# Patient Record
Sex: Female | Born: 2000 | Race: White | Hispanic: No | Marital: Single | Smoking: Never smoker
Health system: Southern US, Community
[De-identification: ages and names within clinical notes are randomized; demographics above are authoritative.]

## PROBLEM LIST (undated history)

## (undated) DIAGNOSIS — L509 Urticaria, unspecified: Secondary | ICD-10-CM

## (undated) DIAGNOSIS — T783XXA Angioneurotic edema, initial encounter: Secondary | ICD-10-CM

## (undated) HISTORY — DX: Urticaria, unspecified: L50.9

## (undated) HISTORY — PX: SINOSCOPY: SHX187

## (undated) HISTORY — DX: Angioneurotic edema, initial encounter: T78.3XXA

---
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
## 2020-03-24 DIAGNOSIS — Z20822 Contact with and (suspected) exposure to covid-19: Secondary | ICD-10-CM | POA: Diagnosis not present

## 2020-04-25 DIAGNOSIS — F322 Major depressive disorder, single episode, severe without psychotic features: Secondary | ICD-10-CM | POA: Diagnosis not present

## 2020-04-26 ENCOUNTER — Ambulatory Visit: Payer: Self-pay | Admitting: Allergy

## 2020-06-07 ENCOUNTER — Other Ambulatory Visit: Payer: Self-pay

## 2020-06-07 ENCOUNTER — Encounter: Payer: Self-pay | Admitting: Allergy

## 2020-06-07 ENCOUNTER — Ambulatory Visit: Payer: BC Managed Care – PPO | Admitting: Allergy

## 2020-06-07 VITALS — BP 114/66 | HR 66 | Temp 98.1°F | Resp 18 | Ht 61.0 in | Wt 140.2 lb

## 2020-06-07 DIAGNOSIS — T783XXD Angioneurotic edema, subsequent encounter: Secondary | ICD-10-CM

## 2020-06-07 DIAGNOSIS — T7840XD Allergy, unspecified, subsequent encounter: Secondary | ICD-10-CM | POA: Diagnosis not present

## 2020-06-07 DIAGNOSIS — T7840XA Allergy, unspecified, initial encounter: Secondary | ICD-10-CM | POA: Insufficient documentation

## 2020-06-07 DIAGNOSIS — J3089 Other allergic rhinitis: Secondary | ICD-10-CM

## 2020-06-07 DIAGNOSIS — T783XXA Angioneurotic edema, initial encounter: Secondary | ICD-10-CM

## 2020-06-07 HISTORY — DX: Angioneurotic edema, initial encounter: T78.3XXA

## 2020-06-07 NOTE — Patient Instructions (Addendum)
Allergic reaction:  Continue to avoid tylenol and NSAIDs including ibuprofen.  For mild symptoms you can take over the counter antihistamines such as Benadryl and monitor symptoms closely. If symptoms worsen or if you have severe symptoms including breathing issues, throat closure, significant swelling, whole body hives, severe diarrhea and vomiting, lightheadedness then seek immediate medical care.  Get bloodwork to rule out other causes.  We are ordering labs, so please allow 1-2 weeks for the results to come back. With the newly implemented Cures Act, the labs might be visible to you at the same time that they become visible to me. However, I will not address the results until all of the results are back, so please be patient.  In the meantime, continue recommendations in your patient instructions, including avoidance measures (if applicable), until you hear from me.  Return for Tylenol drug challenge - bring liquid dye free Tylenol and the Tylenol pill that yyou took.   You must be off antihistamines for 3-5 days before. Must be in good health and not ill. Plan on being in the office for 2-3 hours and must bring in the drug you want to do the oral challenge for.   May 6th at 8:30AM

## 2020-06-07 NOTE — Progress Notes (Signed)
New Patient Note  RE: Angela Hansen MRN: [id number] DOB: 04-16-2000 Date of Office Visit: 06/07/2020  Consult requested by: Sonny Masters, NP Primary care provider: Sonny Masters, NP  Chief Complaint: Allergic Reaction  History of Present Illness: I had the pleasure of seeing Angela Hansen for initial evaluation at the [facility] on 06/08/2020. She is a 20 y.o. female, who is referred here by PCP for the evaluation of allergic reaction.  In December 2021 patient took ibuprofen 200mg  x 2 tablets for menstrual pain. A few hours later patient noted facial swelling, facial rash, tongue tingling, lip swelling, scalp pruritus. No respiratory compromise.  She took some type of allergy pill and the symptoms resolved within a few hours.  About 2 weeks later patient had a headache and took OTC Tylenol 1 pill and about 3-4 hours afterwards she noted periorbital swelling only. She took some type of allergy pill and the symptoms resolved within a few hours.   Suspected triggers are unknown. Denies any fevers, chills, changes in medications, foods, personal care products or recent infections.  Since then she had not taken any NSAID or Tylenol and previously tolerated without any issues. No other history of allergic reactions as above.   Patient was at home from college during these episodes but she has been back home since then with no additional episodes.   Assessment and Plan: Angela Hansen is a 20 y.o. female with: Allergic reaction 2 episodes of allergic reaction in the form of facial swelling, facial rash in December. 1 episode after taking ibuprofen and another after taking Tylenol. Symptoms resolved after taking antihistamines. Previously tolerated without any issues. Denies any other changes in diet, meds, personal care products.  Continue to avoid tylenol and NSAIDs including ibuprofen.  For mild symptoms you can take over the counter antihistamines such as  Benadryl and monitor symptoms closely. If symptoms worsen or if you have severe symptoms including breathing issues, throat closure, significant swelling, whole body hives, severe diarrhea and vomiting, lightheadedness then seek immediate medical care. Get bloodwork to rule out other etiologies.   Return for Tylenol drug challenge first.  Then will do NSAID challenge.   Other allergic rhinitis Mild rhino conjunctivitis symptoms and takes zyrtec prn with good benefit. Usually flares in the fall and spring.  Declines environmental allergy testing today.   May use over the counter antihistamines such as Zyrtec (cetirizine), Claritin (loratadine), Allegra (fexofenadine), or Xyzal (levocetirizine) daily as needed.  Return in about 23 days (around 06/30/2020) for Drug challenge.  No orders of the defined types were placed in this encounter.   Lab Orders     CBC with Differential/Platelet     C1 esterase inhibitor, functional     C1 Esterase Inhibitor     ANA w/Reflex     Alpha-Gal Panel     Comprehensive metabolic panel     Tryptase     Thyroid Cascade Profile     C-reactive protein     Sedimentation rate  Other allergy screening: Asthma: no Rhino conjunctivitis: yes  Nasal congestion, red eyes and takes zyrtec prn with good benefit. Symptoms usually flare in the fall and spring.  Food allergy: no   Hymenoptera allergy: no Urticaria: no Eczema:no History of recurrent infections suggestive of immunodeficency: no  Diagnostics: None.  Past Medical History: Patient Active Problem List   Diagnosis Date Noted  . Other allergic rhinitis 06/08/2020  . Allergic reaction 06/07/2020  . Angio-edema 06/07/2020  Past Medical History:  Diagnosis Date  . Angio-edema   . Urticaria    Past Surgical History: Past Surgical History:  Procedure Laterality Date  . NASAL SEPTUM SURGERY N/A 2018  . SINOSCOPY     Medication List:  Current Outpatient Medications  Medication Sig Dispense  Refill  . cetirizine (ZYRTEC) 10 MG tablet Take 10 mg by mouth daily.    Marland Kitchen lamoTRIgine (LAMICTAL PO) Take 1 tablet by mouth daily.    Marland Kitchen venlafaxine (EFFEXOR) 75 MG tablet Take 75 mg by mouth daily.     No current facility-administered medications for this visit.   Allergies: Not on File Social History: Social History   Socioeconomic History  . Marital status: Unknown    Spouse name: Not on file  . Number of children: Not on file  . Years of education: Not on file  . Highest education level: Not on file  Occupational History  . Not on file  Tobacco Use  . Smoking status: Never Smoker  . Smokeless tobacco: Never Used  Vaping Use  . Vaping Use: Never used  Substance and Sexual Activity  . Alcohol use: Never  . Drug use: Never  . Sexual activity: Not on file  Other Topics Concern  . Not on file  Social History Narrative  . Not on file   Social Determinants of Health   Financial Resource Strain: Not on file  Food Insecurity: Not on file  Transportation Needs: Not on file  Physical Activity: Not on file  Stress: Not on file  Social Connections: Not on file   Lives in a 20 year old house. Smoking: denies Occupation: Hydrographic surveyor HistorySurveyor, minerals in the house: no Engineer, civil (consulting) in the family room: no Carpet in the bedroom: yes Heating: electric Cooling: central Pet: yes 2 dogs, 3 cats  Family History: Family History  Problem Relation Age of Onset  . Allergic rhinitis Mother   . Allergic rhinitis Father    Problem                               Relation Asthma                                   No  Eczema                                No  Food allergy                          Mother   Angioedema    No  Review of Systems  Constitutional: Negative for appetite change, chills, fever and unexpected weight change.  HENT: Negative for congestion and rhinorrhea.   Eyes: Negative for itching.  Respiratory: Negative for cough, chest tightness,  shortness of breath and wheezing.   Cardiovascular: Negative for chest pain.  Gastrointestinal: Negative for abdominal pain.  Genitourinary: Negative for difficulty urinating.  Skin: Negative for rash.  Neurological: Negative for headaches.   Objective: BP 114/66 (BP Location: Left Arm, Patient Position: Sitting, Cuff Size: Normal)   Pulse 66   Temp 98.1 F (36.7 C) (Temporal)   Resp 18   Ht 5\' 1"  (1.549 m)   Wt 140 lb 3.2 oz (63.6 kg)   SpO2 97%  BMI 26.49 kg/m  Body mass index is 26.49 kg/m. Physical Exam Vitals and nursing note reviewed.  Constitutional:      Appearance: Normal appearance. She is well-developed.  HENT:     Head: Normocephalic and atraumatic.     Right Ear: External ear normal.     Left Ear: External ear normal.     Nose: Nose normal.     Mouth/Throat:     Mouth: Mucous membranes are moist.     Pharynx: Oropharynx is clear.  Eyes:     Conjunctiva/sclera: Conjunctivae normal.  Cardiovascular:     Rate and Rhythm: Normal rate and regular rhythm.     Heart sounds: Normal heart sounds. No murmur heard. No friction rub. No gallop.   Pulmonary:     Effort: Pulmonary effort is normal.     Breath sounds: Normal breath sounds. No wheezing, rhonchi or rales.  Abdominal:     Palpations: Abdomen is soft.  Musculoskeletal:     Cervical back: Neck supple.  Skin:    General: Skin is warm.     Findings: No rash.  Neurological:     Mental Status: She is alert and oriented to person, place, and time.  Psychiatric:        Behavior: Behavior normal.    The plan was reviewed with the patient/family, and all questions/concerned were addressed.  It was my pleasure to see Angela Hansen today and participate in her care. Please feel free to contact me with any questions or concerns.  Sincerely,  Wyline Mood, DO Allergy & Immunology  [facility] office: [phone] [facility] office: [phone]

## 2020-06-08 DIAGNOSIS — J3089 Other allergic rhinitis: Secondary | ICD-10-CM

## 2020-06-08 HISTORY — DX: Other allergic rhinitis: J30.89

## 2020-06-08 NOTE — Assessment & Plan Note (Signed)
Mild rhino conjunctivitis symptoms and takes zyrtec prn with good benefit. Usually flares in the fall and spring.  Declines environmental allergy testing today.   May use over the counter antihistamines such as Zyrtec (cetirizine), Claritin (loratadine), Allegra (fexofenadine), or Xyzal (levocetirizine) daily as needed.

## 2020-06-08 NOTE — Assessment & Plan Note (Signed)
2 episodes of allergic reaction in the form of facial swelling, facial rash in December. 1 episode after taking ibuprofen and another after taking Tylenol. Symptoms resolved after taking antihistamines. Previously tolerated without any issues. Denies any other changes in diet, meds, personal care products.  Continue to avoid tylenol and NSAIDs including ibuprofen.  For mild symptoms you can take over the counter antihistamines such as Benadryl and monitor symptoms closely. If symptoms worsen or if you have severe symptoms including breathing issues, throat closure, significant swelling, whole body hives, severe diarrhea and vomiting, lightheadedness then seek immediate medical care. Get bloodwork to rule out other etiologies.   Return for Tylenol drug challenge first.  Then will do NSAID challenge.

## 2020-06-12 LAB — CBC WITH DIFFERENTIAL/PLATELET
Basophils Absolute: 0 10*3/uL (ref 0.0–0.2)
Basos: 1 %
EOS (ABSOLUTE): 0.3 10*3/uL (ref 0.0–0.4)
Eos: 3 %
Hematocrit: 40.6 % (ref 34.0–46.6)
Hemoglobin: 13.6 g/dL (ref 11.1–15.9)
Immature Grans (Abs): 0 10*3/uL (ref 0.0–0.1)
Immature Granulocytes: 0 %
Lymphocytes Absolute: 2.2 10*3/uL (ref 0.7–3.1)
Lymphs: 29 %
MCH: 30.8 pg (ref 26.6–33.0)
MCHC: 33.5 g/dL (ref 31.5–35.7)
MCV: 92 fL (ref 79–97)
Monocytes Absolute: 0.5 10*3/uL (ref 0.1–0.9)
Monocytes: 7 %
Neutrophils Absolute: 4.6 10*3/uL (ref 1.4–7.0)
Neutrophils: 60 %
Platelets: 288 10*3/uL (ref 150–450)
RBC: 4.42 x10E6/uL (ref 3.77–5.28)
RDW: 12.7 % (ref 11.7–15.4)
WBC: 7.6 10*3/uL (ref 3.4–10.8)

## 2020-06-12 LAB — COMPREHENSIVE METABOLIC PANEL
ALT: 16 IU/L (ref 0–32)
AST: 17 IU/L (ref 0–40)
Albumin/Globulin Ratio: 2 (ref 1.2–2.2)
Albumin: 5.1 g/dL — ABNORMAL HIGH (ref 3.9–5.0)
Alkaline Phosphatase: 74 IU/L (ref 42–106)
BUN/Creatinine Ratio: 18 (ref 9–23)
BUN: 13 mg/dL (ref 6–20)
Bilirubin Total: 0.5 mg/dL (ref 0.0–1.2)
CO2: 20 mmol/L (ref 20–29)
Calcium: 9.7 mg/dL (ref 8.7–10.2)
Chloride: 101 mmol/L (ref 96–106)
Creatinine, Ser: 0.71 mg/dL (ref 0.57–1.00)
Globulin, Total: 2.6 g/dL (ref 1.5–4.5)
Glucose: 94 mg/dL (ref 65–99)
Potassium: 4.1 mmol/L (ref 3.5–5.2)
Sodium: 139 mmol/L (ref 134–144)
Total Protein: 7.7 g/dL (ref 6.0–8.5)
eGFR: 126 mL/min/{1.73_m2} (ref >59)

## 2020-06-12 LAB — C1 ESTERASE INHIBITOR, FUNCTIONAL: C1INH Functional/C1INH Total MFr SerPl: >100 %mean normal

## 2020-06-12 LAB — C-REACTIVE PROTEIN: CRP: 2 mg/L (ref 0–10)

## 2020-06-12 LAB — SEDIMENTATION RATE: Sed Rate: 17 mm/hr (ref 0–32)

## 2020-06-12 LAB — TRYPTASE: Tryptase: 6.6 ug/L (ref 2.2–13.2)

## 2020-06-12 LAB — ALPHA-GAL PANEL
Allergen Lamb IgE: 0.13 kU/L — AB
Beef IgE: 0.15 kU/L — AB
IgE (Immunoglobulin E), Serum: 264 IU/mL (ref 6–495)
O215-IgE Alpha-Gal: <0.1 kU/L
Pork IgE: <0.1 kU/L

## 2020-06-12 LAB — ANA W/REFLEX: Anti Nuclear Antibody (ANA): NEGATIVE

## 2020-06-12 LAB — C1 ESTERASE INHIBITOR: C1INH SerPl-mCnc: 31 mg/dL (ref 21–39)

## 2020-06-12 LAB — THYROID CASCADE PROFILE: TSH: 1.87 u[IU]/mL (ref 0.450–4.500)

## 2020-06-30 ENCOUNTER — Encounter: Payer: Self-pay | Admitting: Allergy

## 2020-06-30 ENCOUNTER — Ambulatory Visit: Payer: BC Managed Care – PPO | Admitting: Allergy

## 2020-06-30 ENCOUNTER — Other Ambulatory Visit: Payer: Self-pay

## 2020-06-30 VITALS — BP 118/72 | HR 98 | Temp 97.9°F | Resp 16

## 2020-06-30 DIAGNOSIS — T50995D Adverse effect of other drugs, medicaments and biological substances, subsequent encounter: Secondary | ICD-10-CM | POA: Diagnosis not present

## 2020-06-30 DIAGNOSIS — T7840XD Allergy, unspecified, subsequent encounter: Secondary | ICD-10-CM

## 2020-06-30 NOTE — Patient Instructions (Addendum)
You had a total of 162.5mg  of Tylenol today and 25mg  of liquid Benadryl.   Continue to avoid tylenol and NSAIDs including ibuprofen.  For mild symptoms you can take over the counter antihistamines such as Benadryl (1-2 tablets every 4-6 hours as needed) and monitor symptoms closely. If symptoms worsen or if you have severe symptoms including breathing issues, throat closure, significant swelling, whole body hives, severe diarrhea and vomiting, lightheadedness then seek immediate medical care.  Return for ibuprofen drug challenge.  You must be off antihistamines for 3-5 days before. Must be in good health and not ill. Plan on being in the office for 2-3 hours and must bring in the drug you want to do the oral challenge for.

## 2020-06-30 NOTE — Assessment & Plan Note (Signed)
Past history - 2 episodes of allergic reaction in the form of facial swelling, facial rash in December. 1 episode after taking ibuprofen and another after taking Tylenol. Symptoms resolved after taking antihistamines. Previously tolerated without any issues. Denies any other changes in diet, meds, personal care products.  Interim history - bloodwork unremarkable. No issues with red dye.   Patient developed some itching after first dose (Tylenol 1/8th tablet 40.625mg ). Symptoms did not worsen and repeated the same dose with no worsening symptoms.  Patient received Tylenol 1/4th tablet 81mg  and noted more profound itching. Still no rash or any other symptoms.  Challenge was stopped and treated with Benadryl 25mg .  Monitored for about 1 hour afterwards and itching resolved. Vitals stable.   Continue to avoid tylenol and NSAIDs including ibuprofen.  For mild symptoms you can take over the counter antihistamines such as Benadryl (1-2 tablets every 4-6 hours as needed) and monitor symptoms closely. If symptoms worsen or if you have severe symptoms including breathing issues, throat closure, significant swelling, whole body hives, severe diarrhea and vomiting, lightheadedness then seek immediate medical care.  Return for ibuprofen drug challenge.

## 2020-06-30 NOTE — Progress Notes (Signed)
Follow Up Note  RE: Angela Hansen MRN: [id number] DOB: 11/18/2000 Date of Office Visit: 06/30/2020  Referring provider: No ref. provider found Primary care provider: Sonny Masters, NP  Chief Complaint: Food/Drug Challenge (Tylenol)  Assessment and Plan: Angela Hansen is a 20 y.o. female with: Adverse effect of other drugs, medicaments and biological substances, subsequent encounter Past history - 2 episodes of allergic reaction in the form of facial swelling, facial rash in December. 1 episode after taking ibuprofen and another after taking Tylenol. Symptoms resolved after taking antihistamines. Previously tolerated without any issues. Denies any other changes in diet, meds, personal care products.  Interim history - bloodwork unremarkable. No issues with red dye.   Patient developed some itching after first dose (Tylenol 1/8th tablet 40.625mg ). Symptoms did not worsen and repeated the same dose with no worsening symptoms.  Patient received Tylenol 1/4th tablet 81mg  and noted more profound itching. Still no rash or any other symptoms.  Challenge was stopped and treated with Benadryl 25mg .  Monitored for about 1 hour afterwards and itching resolved. Vitals stable.   Continue to avoid tylenol and NSAIDs including ibuprofen.  For mild symptoms you can take over the counter antihistamines such as Benadryl (1-2 tablets every 4-6 hours as needed) and monitor symptoms closely. If symptoms worsen or if you have severe symptoms including breathing issues, throat closure, significant swelling, whole body hives, severe diarrhea and vomiting, lightheadedness then seek immediate medical care.  Return for ibuprofen drug challenge.  Return for Drug challenge.  Plan: Challenge drug: Tylenol Challenge as per protocol: Failed Total time: 137 min  History of Present Illness: I had the pleasure of seeing Angela Hansen for a follow up visit at the [facility] on 06/30/2020.  She is a 19 y.o. female, who is being followed for adverse drug reaction. Her previous allergy office visit was on 06/07/2020 with Dr. 12. Today she is here for Tylenol drug challenge.   Tolerates beef and pork with no issues.   History of Reaction: 2 episodes of allergic reaction in the form of facial swelling, facial rash in December. 1 episode after taking ibuprofen and another after taking Tylenol. Symptoms resolved after taking antihistamines. Previously tolerated without any issues. No additional reactions since the last visit and avoiding any type of pain killers.  Interval History: Patient has not been ill, she has not had any accidental exposures to the culprit medication.   Recent/Current History: Pulmonary disease: no Cardiac disease: no Respiratory infection: no Rash: no Itch: no Swelling: no Cough: no Shortness of breath: no Runny/stuffy nose: no Itchy eyes: no Beta-blocker use: no  Patient/guardian was informed of the test procedure with verbalized understanding of the risk of anaphylaxis. Consent was signed.   Last antihistamine use: none in the past 3 days. Last beta-blocker use: n/a  Medication List:  Current Outpatient Medications  Medication Sig Dispense Refill  . cetirizine (ZYRTEC) 10 MG tablet Take 10 mg by mouth daily.    Angela Hansen lamoTRIgine (LAMICTAL PO) Take 1 tablet by mouth daily.    Angela Hansen venlafaxine (EFFEXOR) 75 MG tablet Take 75 mg by mouth daily.     No current facility-administered medications for this visit.   Allergies: Allergies  Allergen Reactions  . Tylenol [Acetaminophen]     Itching during drug challenge   I reviewed her past medical history, social history, family history, and environmental history and no significant changes have been reported from her previous visit.  Review of Systems  Constitutional: Negative for appetite change, chills, fever and unexpected weight change.  HENT: Negative for congestion and rhinorrhea.   Eyes: Negative  for itching.  Respiratory: Negative for cough, chest tightness, shortness of breath and wheezing.   Cardiovascular: Negative for chest pain.  Gastrointestinal: Negative for abdominal pain.  Genitourinary: Negative for difficulty urinating.  Skin: Negative for rash.  Neurological: Negative for headaches.   Objective: BP 118/72   Pulse 98   Temp 97.9 F (36.6 C) (Temporal)   Resp 16   SpO2 96%  There is no height or weight on file to calculate BMI. Physical Exam Vitals and nursing note reviewed.  Constitutional:      Appearance: Normal appearance. She is well-developed.  HENT:     Head: Normocephalic and atraumatic.     Right Ear: External ear normal.     Left Ear: External ear normal.     Nose: Nose normal.     Mouth/Throat:     Mouth: Mucous membranes are moist.     Pharynx: Oropharynx is clear.  Eyes:     Conjunctiva/sclera: Conjunctivae normal.  Cardiovascular:     Rate and Rhythm: Normal rate and regular rhythm.     Heart sounds: Normal heart sounds. No murmur heard. No friction rub. No gallop.   Pulmonary:     Effort: Pulmonary effort is normal.     Breath sounds: Normal breath sounds. No wheezing, rhonchi or rales.  Abdominal:     Palpations: Abdomen is soft.  Musculoskeletal:     Cervical back: Neck supple.  Skin:    General: Skin is warm.     Findings: No rash.  Neurological:     Mental Status: She is alert and oriented to person, place, and time.  Psychiatric:        Behavior: Behavior normal.     Diagnostics: Results discussed with patient/family.  Oral Challenge - 06/30/20 0900    Challenge Food/Drug Tylenol    Lot #  if Applicable [id number]    Food/Drug provided by Practice    Comments Exp: 08/2020    BP 118/72    Pulse 98    Respirations 16    Lungs 96%    Skin Clear    Mouth Clear    Time 0859    Dose 1/8 of a tablet    Time 0931    Dose 1/8 of a tablet    Time 0947    Dose 1/4 of a tablet    BP 116/74    Pulse 81    Respirations 16     Lungs 97%    Skin Clear    Mouth Clear           Previous notes and tests were reviewed. The plan was reviewed with the patient/family, and all questions/concerned were addressed.  It was my pleasure to see Angela Hansen today and participate in her care. Please feel free to contact me with any questions or concerns.  Sincerely,  Wyline Mood, DO Allergy & Immunology  [facility] office: [phone] [facility] office: [phone]

## 2020-10-25 DIAGNOSIS — Z113 Encounter for screening for infections with a predominantly sexual mode of transmission: Secondary | ICD-10-CM | POA: Diagnosis not present

## 2020-10-25 DIAGNOSIS — Z3202 Encounter for pregnancy test, result negative: Secondary | ICD-10-CM | POA: Diagnosis not present

## 2020-11-01 DIAGNOSIS — Z30430 Encounter for insertion of intrauterine contraceptive device: Secondary | ICD-10-CM | POA: Diagnosis not present

## 2020-11-01 DIAGNOSIS — Z3202 Encounter for pregnancy test, result negative: Secondary | ICD-10-CM | POA: Diagnosis not present

## 2020-11-14 ENCOUNTER — Other Ambulatory Visit: Payer: Self-pay

## 2020-11-14 ENCOUNTER — Emergency Department (HOSPITAL_COMMUNITY)
Admission: EM | Admit: 2020-11-14 | Discharge: 2020-11-15 | Disposition: A | Discharge status: Home / Self Care | Payer: BC Managed Care – PPO | Attending: Emergency Medicine | Admitting: Emergency Medicine

## 2020-11-14 ENCOUNTER — Encounter (HOSPITAL_COMMUNITY): Payer: Self-pay

## 2020-11-14 DIAGNOSIS — M79605 Pain in left leg: Secondary | ICD-10-CM | POA: Diagnosis not present

## 2020-11-14 DIAGNOSIS — Z5321 Procedure and treatment not carried out due to patient leaving prior to being seen by health care provider: Secondary | ICD-10-CM | POA: Insufficient documentation

## 2020-11-14 DIAGNOSIS — R109 Unspecified abdominal pain: Secondary | ICD-10-CM | POA: Insufficient documentation

## 2020-11-14 LAB — URINALYSIS, MICROSCOPIC (REFLEX): Bacteria, UA: NONE SEEN

## 2020-11-14 LAB — COMPREHENSIVE METABOLIC PANEL
ALT: 18 U/L (ref 0–44)
AST: 20 U/L (ref 15–41)
Albumin: 4.4 g/dL (ref 3.5–5.0)
Alkaline Phosphatase: 47 U/L (ref 38–126)
Anion gap: 8 (ref 5–15)
BUN: 9 mg/dL (ref 6–20)
CO2: 23 mmol/L (ref 22–32)
Calcium: 9.4 mg/dL (ref 8.9–10.3)
Chloride: 106 mmol/L (ref 98–111)
Creatinine, Ser: 0.57 mg/dL (ref 0.44–1.00)
GFR, Estimated: >60 mL/min (ref >60)
Glucose, Bld: 92 mg/dL (ref 70–99)
Potassium: 3.5 mmol/L (ref 3.5–5.1)
Sodium: 137 mmol/L (ref 135–145)
Total Bilirubin: 0.9 mg/dL (ref 0.3–1.2)
Total Protein: 7.5 g/dL (ref 6.5–8.1)

## 2020-11-14 LAB — URINALYSIS, ROUTINE W REFLEX MICROSCOPIC
Bilirubin Urine: NEGATIVE
Glucose, UA: NEGATIVE mg/dL
Ketones, ur: >80 mg/dL — AB
Leukocytes,Ua: NEGATIVE
Nitrite: NEGATIVE
Protein, ur: NEGATIVE mg/dL
Specific Gravity, Urine: 1.02 (ref 1.005–1.030)
pH: 6.5 (ref 5.0–8.0)

## 2020-11-14 LAB — CBC
HCT: 38.6 % (ref 36.0–46.0)
Hemoglobin: 13.6 g/dL (ref 12.0–15.0)
MCH: 31.6 pg (ref 26.0–34.0)
MCHC: 35.2 g/dL (ref 30.0–36.0)
MCV: 89.8 fL (ref 80.0–100.0)
Platelets: 259 10*3/uL (ref 150–400)
RBC: 4.3 MIL/uL (ref 3.87–5.11)
RDW: 12.2 % (ref 11.5–15.5)
WBC: 11.2 10*3/uL — ABNORMAL HIGH (ref 4.0–10.5)
nRBC: 0 % (ref 0.0–0.2)

## 2020-11-14 LAB — I-STAT BETA HCG BLOOD, ED (MC, WL, AP ONLY): I-stat hCG, quantitative: <5 m[IU]/mL (ref <5)

## 2020-11-14 LAB — LIPASE, BLOOD: Lipase: 22 U/L (ref 11–51)

## 2020-11-14 NOTE — ED Triage Notes (Signed)
Pt complains of intermittent left leg pain and left abdominal pain x 2 days.

## 2020-11-15 ENCOUNTER — Other Ambulatory Visit: Payer: Self-pay | Admitting: Family Medicine

## 2020-11-15 DIAGNOSIS — R102 Pelvic and perineal pain: Secondary | ICD-10-CM

## 2020-11-17 ENCOUNTER — Ambulatory Visit
Admission: RE | Admit: 2020-11-17 | Discharge: 2020-11-17 | Disposition: A | Discharge status: Home / Self Care | Payer: BC Managed Care – PPO | Source: Ambulatory Visit | Attending: Family Medicine | Admitting: Family Medicine

## 2020-11-17 DIAGNOSIS — R102 Pelvic and perineal pain: Secondary | ICD-10-CM

## 2020-11-17 DIAGNOSIS — N888 Other specified noninflammatory disorders of cervix uteri: Secondary | ICD-10-CM | POA: Diagnosis not present

## 2020-11-17 DIAGNOSIS — N83202 Unspecified ovarian cyst, left side: Secondary | ICD-10-CM | POA: Diagnosis not present

## 2020-12-22 ENCOUNTER — Ambulatory Visit: Payer: BC Managed Care – PPO | Admitting: Family Medicine

## 2020-12-22 ENCOUNTER — Encounter: Payer: Self-pay | Admitting: Family Medicine

## 2020-12-22 ENCOUNTER — Other Ambulatory Visit: Payer: Self-pay

## 2020-12-22 VITALS — BP 116/76 | HR 98 | Temp 98.2°F | Resp 18 | Ht 61.0 in | Wt 140.2 lb

## 2020-12-22 DIAGNOSIS — Z888 Allergy status to other drugs, medicaments and biological substances status: Secondary | ICD-10-CM

## 2020-12-22 DIAGNOSIS — T391X5A Adverse effect of 4-Aminophenol derivatives, initial encounter: Secondary | ICD-10-CM | POA: Diagnosis not present

## 2020-12-22 DIAGNOSIS — Z889 Allergy status to unspecified drugs, medicaments and biological substances status: Secondary | ICD-10-CM

## 2020-12-22 DIAGNOSIS — T50995D Adverse effect of other drugs, medicaments and biological substances, subsequent encounter: Secondary | ICD-10-CM

## 2020-12-22 HISTORY — DX: Allergy status to unspecified drugs, medicaments and biological substances: Z88.9

## 2020-12-22 NOTE — Patient Instructions (Addendum)
In office oral drug challenge Angela Hansen was not able to tolerate the ibuprofen challenge in the clinic today. - Monitor for allergic symptoms such as rash, wheezing, diarrhea, swelling, and vomiting for the next 24 hours. If severe symptoms occur call 911. For less severe symptoms treat with Benadryl 50 mg every 4 hours and call the clinic.  - Continue to avoid NSAIDS including ibuprofen and acetaminophen at this time  Call the clinic if this treatment plan is not working well for you  Follow up in 1 month or sooner if needed.

## 2020-12-22 NOTE — Progress Notes (Signed)
[address] [place] [address] Dept: [phone]  FOLLOW UP NOTE  Patient ID: Angela Hansen, female    DOB: [birth year]  Age: 20 y.o. MRN: [id number] Date of Office Visit: 12/22/2020  Assessment  Chief Complaint: Food/Drug Challenge, Angioedema (No flares ), and Urticaria (No flares )  HPI Angela Hansen is a 20 year old female who presents the clinic for follow-up visit.  She was last seen in this clinic on 06/30/2020 by Dr. Selena Batten for anTylenol challenge which resulted in reaction and prior to that she was seen on 06/07/2020 by Dr. Selena Batten for evaluation of allergic reaction to ibuprofen and Tylenol as well as allergic rhinitis.  She reports that she had taken ibuprofen with no adverse effect until about 1 year ago.  She reports at that time that she took ibuprofen 400 mg and began to experience lower lip swelling and shortly after she began to experience swelling around her left eye.  She also reports itching on her arms with no development of hives.  She reports that she took cetirizine at that time and within 1 to 2 hours began to experience relief.  She reports the entire episode lasted between 4 and 6 hours and resolved completely at that time.  She denies cardiopulmonary or gastrointestinal symptoms with the lip and eye swelling.  At today's visit, she reports she is feeling well overall with no cardiopulmonary, integumentary, or gastrointestinal symptoms.  She has not taken any antihistamines for the last 3 days.  Her current medications are listed in the chart.   Drug Allergies:  Allergies  Allergen Reactions   Tylenol [Acetaminophen]     Itching during drug challenge    Physical Exam: BP 116/76   Pulse 98   Temp 98.2 F (36.8 C)   Resp 18   Ht 5\' 1"  (1.549 m)   Wt 140 lb 3.2 oz (63.6 kg)   SpO2 97%   BMI 26.49 kg/m    Physical Exam Vitals reviewed.  Constitutional:      Appearance: Normal appearance.  HENT:     Head: Normocephalic and atraumatic.      Right Ear: Tympanic membrane normal.     Left Ear: Tympanic membrane normal.     Nose:     Comments: Bilateral nares normal.  Pharynx normal.  Ears normal.  Eyes normal.    Mouth/Throat:     Pharynx: Oropharynx is clear.  Eyes:     Conjunctiva/sclera: Conjunctivae normal.  Cardiovascular:     Rate and Rhythm: Normal rate and regular rhythm.     Heart sounds: Normal heart sounds. No murmur heard. Pulmonary:     Effort: Pulmonary effort is normal.     Breath sounds: Normal breath sounds.     Comments: Lungs clear to auscultation Musculoskeletal:        General: Normal range of motion.     Cervical back: Normal range of motion and neck supple.  Skin:    General: Skin is warm and dry.  Neurological:     Mental Status: She is alert and oriented to person, place, and time.  Psychiatric:        Mood and Affect: Mood normal.        Behavior: Behavior normal.        Thought Content: Thought content normal.        Judgment: Judgment normal.     Procedure note: Written consent obtained Open graded ibuprofen oral challenge: The patient was not able to tolerate the  challenge today. She was given approximately 1% dose of ibuprofen 600 mg and reported slight itching on her left thumb and on her scalp. She reported these symptoms were mild and she did not experience and cardiopulmonary, gastrointestinal, or integumentary symptoms. Vitals were stable. Physical exam stable. The dose was repeated and she reported the itch became widespread and increased in intensity. Vitals and physical exam remained within normal limits at that time. She again denied cardiopulmonary and gastrointestinal symptoms. She was given cetirizine 10 mg with resolution of symptoms within 20 minutes.  She was monitored for 60 minutes following the last dose. Vital signs and physical exam normal upon discharge.  Total testing time: 134 minutes  Assessment and Plan: 1. Adverse effect of other drugs, medicaments and  biological substances, subsequent encounter     Patient Instructions  In office oral drug challenge Angela Hansen was not able to tolerate the ibuprofen challenge in the clinic today. - Monitor for allergic symptoms such as rash, wheezing, diarrhea, swelling, and vomiting for the next 24 hours. If severe symptoms occur call 911. For less severe symptoms treat with Benadryl 50 mg every 4 hours and call the clinic.  - Continue to avoid NSAIDS including ibuprofen and acetaminophen at this time  Call the clinic if this treatment plan is not working well for you  Follow up in 1 month or sooner if needed.   Return in about 4 weeks (around 01/19/2021), or if symptoms worsen or fail to improve.    Thank you for the opportunity to care for this patient.  Please do not hesitate to contact me with questions.  Thermon Leyland, FNP [facility]

## 2021-02-13 DIAGNOSIS — N946 Dysmenorrhea, unspecified: Secondary | ICD-10-CM | POA: Diagnosis not present

## 2021-02-13 DIAGNOSIS — Z889 Allergy status to unspecified drugs, medicaments and biological substances status: Secondary | ICD-10-CM | POA: Diagnosis not present

## 2021-02-13 DIAGNOSIS — M419 Scoliosis, unspecified: Secondary | ICD-10-CM | POA: Diagnosis not present

## 2021-02-13 DIAGNOSIS — F3181 Bipolar II disorder: Secondary | ICD-10-CM | POA: Diagnosis not present

## 2021-04-20 DIAGNOSIS — Z309 Encounter for contraceptive management, unspecified: Secondary | ICD-10-CM | POA: Diagnosis not present

## 2021-05-21 DIAGNOSIS — Z30430 Encounter for insertion of intrauterine contraceptive device: Secondary | ICD-10-CM | POA: Diagnosis not present

## 2021-05-21 DIAGNOSIS — Z113 Encounter for screening for infections with a predominantly sexual mode of transmission: Secondary | ICD-10-CM | POA: Diagnosis not present

## 2021-05-21 DIAGNOSIS — Z3202 Encounter for pregnancy test, result negative: Secondary | ICD-10-CM | POA: Diagnosis not present

## 2021-09-01 DIAGNOSIS — J020 Streptococcal pharyngitis: Secondary | ICD-10-CM | POA: Diagnosis not present

## 2021-10-09 ENCOUNTER — Ambulatory Visit: Payer: BC Managed Care – PPO | Admitting: Allergy & Immunology

## 2021-10-11 ENCOUNTER — Encounter: Payer: Self-pay | Admitting: Allergy & Immunology

## 2021-10-11 ENCOUNTER — Ambulatory Visit (INDEPENDENT_AMBULATORY_CARE_PROVIDER_SITE_OTHER): Payer: BC Managed Care – PPO | Admitting: Allergy & Immunology

## 2021-10-11 VITALS — BP 110/72 | HR 93 | Temp 98.4°F | Resp 16

## 2021-10-11 DIAGNOSIS — Z889 Allergy status to unspecified drugs, medicaments and biological substances status: Secondary | ICD-10-CM | POA: Diagnosis not present

## 2021-10-11 DIAGNOSIS — T50995A Adverse effect of other drugs, medicaments and biological substances, initial encounter: Secondary | ICD-10-CM | POA: Diagnosis not present

## 2021-10-11 DIAGNOSIS — T50995D Adverse effect of other drugs, medicaments and biological substances, subsequent encounter: Secondary | ICD-10-CM

## 2021-10-11 DIAGNOSIS — Z886 Allergy status to analgesic agent status: Secondary | ICD-10-CM | POA: Diagnosis not present

## 2021-10-11 DIAGNOSIS — M5430 Sciatica, unspecified side: Secondary | ICD-10-CM

## 2021-10-11 MED ORDER — CELECOXIB 100 MG PO CAPS: ORAL_CAPSULE | ORAL | 0 refills | Status: DC

## 2021-10-11 NOTE — Progress Notes (Signed)
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  These can be tolerated by patients with ibuprofen and other NSAID allergies.  We are going to schedule this as soon as we can in the [place] office.  If this does not work, I think we need to send her to pain management for evaluation of other treatment modalities.  She mentioned a muscle relaxant today, but I am not comfortable prescribing muscle relaxants and I think her needs to be best suited with pain management if the challenge does not pan out.  Plan/Recommendations:   1. Adverse effect of other drugs - I sent in celecoxib (Celebrex) to see if we can tolerate that. - We will schedule you a challenge as soon as we can.   2. Follow up for an observed drug challenge.    Subjective:   Angela Hansen is a 21 y.o. female presenting today for follow up of No chief complaint on file.   Angela Hansen has a history of the following: Patient Active Problem List   Diagnosis Date Noted   Drug allergy 12/22/2020   Adverse effect of other drugs, medicaments and biological substances, subsequent encounter 06/30/2020   Other allergic rhinitis 06/08/2020   Allergic reaction 06/07/2020   Angio-edema 06/07/2020    History obtained from: chart review and patient.  Angela Hansen is a 21 y.o. female presenting for a follow up visit. She was last seen in October 2022 for an ibuprofen challenge. She failed it. She also failed a Tylenol challenge in May 2022 by Dr. Selena Batten.   In the interim, she has contracted sciatica. There is nothing over the counter that she can take for management of this. She had an IUD placed a year ago which resulted in sciatica. She was given a muscle  relaxant at some point. This was in 2022. This muscle relaxant did help. She had two of them in the bottle and they did help but she never got any additional ones. She took one that night and then another the night after.   She started having issues two years ago when she took some ibuprofen with swelling of her face. Part of her eye was swollen and then her lips were swollen. One month after that, she had acetaminophen and 4 hours later she had the swelling.   She on Lamictal for bipolar and Effexor is for depression. These medication regimens have not changed at all.   Otherwise, there have been no changes to her past medical history, surgical history, family history, or social history.    Review of Systems  Constitutional: Negative.  Negative for fever, malaise/fatigue and weight loss.  HENT: Negative.  Negative for congestion, ear discharge, ear pain and sinus pain.   Eyes:  Negative for pain, discharge and redness.  Respiratory:  Negative for cough, sputum production, shortness of breath and wheezing.   Cardiovascular: Negative.  Negative for chest pain and palpitations.  Gastrointestinal:  Negative for abdominal pain and heartburn.  Musculoskeletal:  Positive for joint pain.  Skin: Negative.  Negative for itching and rash.  Neurological:  Negative for dizziness, tingling, tremors and headaches.  Endo/Heme/Allergies:  Negative for environmental allergies. Does not bruise/bleed easily.  Psychiatric/Behavioral:  Positive  for depression.        Objective:   Blood pressure 110/72, pulse 93, temperature 98.4 F (36.9 C), resp. rate 16, SpO2 98 %. There is no height or weight on file to calculate BMI.    Physical Exam Vitals reviewed.  Constitutional:      Appearance: She is well-developed.     Comments: Pleasant.   HENT:     Head: Normocephalic and atraumatic.     Right Ear: Tympanic membrane, ear canal and external ear normal.     Left Ear: Tympanic membrane, ear canal and  external ear normal.     Nose: No nasal deformity, septal deviation, mucosal edema or rhinorrhea.     Right Turbinates: Not enlarged or swollen.     Left Turbinates: Not enlarged or swollen.     Right Sinus: No maxillary sinus tenderness or frontal sinus tenderness.     Left Sinus: No maxillary sinus tenderness or frontal sinus tenderness.     Mouth/Throat:     Mouth: Mucous membranes are not pale and not dry.     Pharynx: Uvula midline.  Eyes:     General: Lids are normal. No allergic shiner.       Right eye: No discharge.        Left eye: No discharge.     Conjunctiva/sclera: Conjunctivae normal.     Right eye: Right conjunctiva is not injected. No chemosis.    Left eye: Left conjunctiva is not injected. No chemosis.    Pupils: Pupils are equal, round, and reactive to light.  Cardiovascular:     Rate and Rhythm: Normal rate and regular rhythm.     Heart sounds: Normal heart sounds.  Pulmonary:     Effort: Pulmonary effort is normal. No tachypnea, accessory muscle usage or respiratory distress.     Breath sounds: Normal breath sounds. No wheezing, rhonchi or rales.  Chest:     Chest wall: No tenderness.  Lymphadenopathy:     Cervical: No cervical adenopathy.  Skin:    Coloration: Skin is not pale.     Findings: No abrasion, erythema, petechiae or rash. Rash is not papular, urticarial or vesicular.  Neurological:     Mental Status: She is alert.  Psychiatric:        Behavior: Behavior is cooperative.      Diagnostic studies: none        Malachi Bonds, MD  [facility]

## 2021-10-11 NOTE — Patient Instructions (Addendum)
1. Adverse effect of other drugs - I sent in celecoxib (Celebrex) to see if we can tolerate that. - We will schedule you a challenge as soon as we can.   2. Follow up for an observed drug challenge.     Please inform us of any Emergency Department visits, hospitalizations, or changes in symptoms. Call us before going to the ED for breathing or allergy symptoms since we might be able to fit you in for a sick visit. Feel free to contact us anytime with any questions, problems, or concerns.  It was a pleasure to meet you today!  Websites that have reliable patient information: 1. American Academy of Asthma, Allergy, and Immunology: www.aaaai.org 2. Food Allergy Research and Education (FARE): foodallergy.org 3. Mothers of Asthmatics: http://www.asthmacommunitynetwork.org 4. American College of Allergy, Asthma, and Immunology: www.acaai.org   COVID-19 Vaccine Information can be found at: [email or web address].[email or web address] For questions related to vaccine distribution or appointments, please email [email or web address] or call [phone].   We realize that you might be concerned about having an allergic reaction to the COVID19 vaccines. To help with that concern, WE ARE OFFERING THE COVID19 VACCINES IN OUR OFFICE! Ask the front desk for dates!     "Like" [place] on Facebook and Instagram for our latest updates!      A healthy democracy works best when [employer] participate! Make sure you are registered to vote! If you have moved or changed any of your contact information, you will need to get this updated before voting!  In some cases, you MAY be able to register to vote online: [email or web address]

## 2021-10-12 ENCOUNTER — Encounter: Payer: Self-pay | Admitting: Allergy & Immunology

## 2021-11-12 ENCOUNTER — Other Ambulatory Visit: Payer: Self-pay | Admitting: Allergy & Immunology

## 2021-11-12 ENCOUNTER — Encounter: Payer: BC Managed Care – PPO | Admitting: Family Medicine

## 2021-11-12 MED ORDER — CELECOXIB 100 MG PO CAPS: ORAL_CAPSULE | ORAL | 0 refills | Status: DC

## 2021-11-14 NOTE — Progress Notes (Signed)
[address] [place] [address] Dept: [phone]  FOLLOW UP NOTE  Patient ID: Angela Hansen, female    DOB: 09-Nov-2000  Age: 21 y.o. MRN: [id number] Date of Office Visit: 11/15/2021  Assessment  Chief Complaint: Food/Drug Challenge (Celebrex 100 mg capsule)  HPI Angela Hansen is a 21 year old female who presents the clinic for follow-up visit.  She was last seen in this clinic on 10/11/2021 by Dr. Dellis Anes for evaluation of NSAID allergy.  Her medical problem list includes sciatica, presumably from IUD placement, and bipolar depression. She is accompanied by her mother who assists with history. At today's visit, she reports that she is feeling well overall with no cardiopulmonary, integumentary, or gastrointestinal symptoms. She has not taken any antihistamines over the last 3 days. She reports that she infrequently eats bites of pork and beef without reaction. She did, however, have slightly elevated beef IgE and lamb IgE on blood work from 06/07/2020. Her current medications are listed in the chart.    Drug Allergies:  Allergies  Allergen Reactions   Ibuprofen Itching and Swelling   Tylenol [Acetaminophen]     Itching during drug challenge    Physical Exam: BP 100/68   Pulse 80   Temp 98.1 F (36.7 C)   Resp 16   Ht 5\' 1"  (1.549 m)   Wt 168 lb 6.4 oz (76.4 kg)   SpO2 99%   BMI 31.82 kg/m    Physical Exam  Procedure note: Written consent obtained Open graded celecoxib oral challenge: The patient was able to tolerate the challenge today without adverse signs or symptoms. Vital signs were stable throughout the challenge and observation period. She received multiple doses separated by 30 minutes, each of which was separated by a brief physical exam. She received the following doses: 1%, 10%, and 89% for a total of 200 mg of celecoxib. The outer capsule covering was not consumed. She was monitored for 60 minutes following the last dose.  Total testing  time: 165 minutes  The patient was able to tolerate the open graded oral challenge today without adverse signs or symptoms. Therefore, she has the same risk of systemic reaction associated with  the use of celecoxib  as the general population.   Assessment and Plan: 1. Allergy, drug   2. Adverse effect of other drugs, medicaments and biological substances, subsequent encounter   3. Allergy with anaphylaxis due to food     Patient Instructions  In office oral Celecoxib challenge Angela Hansen was able to tolerate the celecoxib drug challenge today at the office without adverse signs or symptoms of an allergic reaction. Therefore, she has the same risk of systemic reaction associated with the use of celecoxib as the general population.  - Do not give any celecoxib  for the next 24 hours. - Monitor for allergic symptoms such as rash, wheezing, diarrhea, swelling, and vomiting for the next 24 hours. If severe symptoms occur, treat with EpiPen injection and call 911. For less severe symptoms treat with Benadryl 4 teaspoonfuls every 4 hours and call the clinic.  -If you develop an allergic reaction to celecoxib , record what was eaten the amount eaten, preparation method, time from ingestion to reaction, and symptoms.  - Do not ingest the outer capsule covering. Open the capsule and empty the contents into a small amount of applesauce and ingest. Use celecoxib as prescribed by your primary care provider  Food allergy We have ordered some lab work to help Angela Hansen evaluate  your food allergies. We will call you when the results become available  Call the clinic if this treatment plan is not working well for you  Follow up in 6 months or sooner if needed.   Return in about 6 months (around 05/16/2022), or if symptoms worsen or fail to improve.    Thank you for the opportunity to care for this patient.  Please do not hesitate to contact me with questions.  Gareth Morgan, FNP [facility]

## 2021-11-14 NOTE — Patient Instructions (Incomplete)
In office oral Celecoxib challenge Angela Hansen was able to tolerate the celecoxib drug challenge today at the office without adverse signs or symptoms of an allergic reaction. Therefore, she has the same risk of systemic reaction associated with the use of celecoxib as the general population.  - Do not give any celecoxib  for the next 24 hours. - Monitor for allergic symptoms such as rash, wheezing, diarrhea, swelling, and vomiting for the next 24 hours. If severe symptoms occur, treat with EpiPen injection and call 911. For less severe symptoms treat with Benadryl 4 teaspoonfuls every 4 hours and call the clinic.  -If you develop an allergic reaction to celecoxib , record what was eaten the amount eaten, preparation method, time from ingestion to reaction, and symptoms.  - Do not ingest the outer capsule covering. Open the capsule and empty the contents into a small amount of applesauce and ingest. Use celecoxib as prescribed by your primary care provider  Food allergy We have ordered some lab work to help [place] evaluate your food allergies. We will call you when the results become available  Call the clinic if this treatment plan is not working well for you  Follow up in 6 months or sooner if needed.

## 2021-11-15 ENCOUNTER — Ambulatory Visit: Payer: BC Managed Care – PPO | Admitting: Family Medicine

## 2021-11-15 ENCOUNTER — Encounter: Payer: Self-pay | Admitting: Family Medicine

## 2021-11-15 VITALS — BP 100/68 | HR 80 | Temp 98.1°F | Resp 16 | Ht 61.0 in | Wt 168.4 lb

## 2021-11-15 DIAGNOSIS — Z888 Allergy status to other drugs, medicaments and biological substances status: Secondary | ICD-10-CM | POA: Diagnosis not present

## 2021-11-15 DIAGNOSIS — Z889 Allergy status to unspecified drugs, medicaments and biological substances status: Secondary | ICD-10-CM | POA: Diagnosis not present

## 2021-11-15 DIAGNOSIS — T7800XA Anaphylactic reaction due to unspecified food, initial encounter: Secondary | ICD-10-CM

## 2021-11-15 DIAGNOSIS — T50995D Adverse effect of other drugs, medicaments and biological substances, subsequent encounter: Secondary | ICD-10-CM

## 2021-11-18 LAB — ALPHA-GAL PANEL
Allergen Lamb IgE: 0.14 kU/L — AB
Beef IgE: 0.16 kU/L — AB
IgE (Immunoglobulin E), Serum: 94 IU/mL (ref 6–495)
O215-IgE Alpha-Gal: <0.1 kU/L
Pork IgE: <0.1 kU/L

## 2021-11-18 LAB — C074-IGE GELATIN: C074-IgE Gelatin: <0.1 kU/L

## 2021-11-19 NOTE — Progress Notes (Signed)
Can you please let this patient know that her alpha gal testing was negative, however, she still does have a tiny bit of sensitivity to lamb and beef on blood work. Her gelatin testing was negative. She can come into the clinic for akin testing to beef and lamb if she is interested. Please ask her if she remained symptom free after the celecoxib challenge. Thank you

## 2021-11-21 ENCOUNTER — Telehealth: Payer: Self-pay | Admitting: Family Medicine

## 2021-11-21 NOTE — Telephone Encounter (Signed)
Pt returning a call about lab results  

## 2021-11-22 NOTE — Telephone Encounter (Signed)
Lm for pt to call us back about labs 

## 2021-11-29 DIAGNOSIS — Z30432 Encounter for removal of intrauterine contraceptive device: Secondary | ICD-10-CM | POA: Diagnosis not present

## 2021-12-06 NOTE — Telephone Encounter (Signed)
LVM on patient's phone to contact the office to go over lab results. Patient's mychart message has not been read.

## 2022-01-04 ENCOUNTER — Ambulatory Visit: Payer: BC Managed Care – PPO | Admitting: Internal Medicine

## 2022-01-04 VITALS — BP 120/81 | HR 68 | Temp 97.6°F | Ht 60.0 in | Wt 172.8 lb

## 2022-01-04 DIAGNOSIS — Z0000 Encounter for general adult medical examination without abnormal findings: Secondary | ICD-10-CM

## 2022-01-04 DIAGNOSIS — F319 Bipolar disorder, unspecified: Secondary | ICD-10-CM

## 2022-01-04 DIAGNOSIS — Z1159 Encounter for screening for other viral diseases: Secondary | ICD-10-CM | POA: Diagnosis not present

## 2022-01-04 DIAGNOSIS — Z114 Encounter for screening for human immunodeficiency virus [HIV]: Secondary | ICD-10-CM

## 2022-01-04 DIAGNOSIS — E669 Obesity, unspecified: Secondary | ICD-10-CM

## 2022-01-04 DIAGNOSIS — K625 Hemorrhage of anus and rectum: Secondary | ICD-10-CM | POA: Diagnosis not present

## 2022-01-04 DIAGNOSIS — K921 Melena: Secondary | ICD-10-CM

## 2022-01-04 DIAGNOSIS — T7840XD Allergy, unspecified, subsequent encounter: Secondary | ICD-10-CM

## 2022-01-04 NOTE — Progress Notes (Unsigned)
CC: Establish Care  HPI:  Angela Hansen is a 21 y.o. female with a past medical history of bipolar disorder, back pain 2/2 to scoliosis, dysmenorrhea, and allergies and presenting to the clinic today to establish care. Please see problem based assessment and plan for additional details.Pt had bloody BM's for 2 days after halloween that subsided. Was seen by [facility] until 19.   Past Medical Hx: Bipolar Disorder Scoliosis Allergies c/b angio-edema  Dysmenorrhea  Meds:    Current Outpatient Medications (Respiratory):    cetirizine (ZYRTEC) 10 MG tablet, Take 10 mg by mouth daily.  Current Outpatient Medications (Analgesics):    celecoxib (CELEBREX) 100 MG capsule, Take 1 capsule (100 mg total) by mouth 2 (two) times daily as needed.   Current Outpatient Medications (Other):    lamoTRIgine (LAMICTAL) 25 MG tablet, Take 1 tablet (25 mg total) by mouth daily.   venlafaxine (EFFEXOR) 75 MG tablet, Take 1 tablet (75 mg total) by mouth daily.   Allergies: Tyelnol, Ibuprofen, intolerance to beef and lamb  Family History  Problem Relation Age of Onset   Allergic rhinitis Mother    Allergic rhinitis Father   Breast Cancer: Mom, Grandmother in mother's fathers Bladder Cancer: Father DMII: Father, Grandmother, Both Aunt.  Bipolar Disorder: 7 Brothers, Father ADHD: 2 Brothers  Social History:  Lives in [place]. Works at [employer]. Does not smoke, occasionally wine twice weekly. Can do all ADL's and iADLs.   Review of Systems: ROS negative except for what is noted on the assessment and plan.  Vitals:   01/04/22 1115  BP: 120/81  Pulse: 68  Temp: 97.6 F (36.4 C)  TempSrc: Oral  SpO2: 100%  Weight: 172 lb 12.8 oz (78.4 kg)  Height: 5' (1.524 m)     Physical Exam: General: Well appearing, NAD HENT: normocephalic, atraumatic EYES: conjunctiva non-erythematous, no scleral icterus CV: regular rate, normal rhythm, no murmurs, rubs,  gallops. Pulmonary: normal work of breathing on RA, lungs clear to auscultation, no rales, wheezes, rhonchi Abdominal: non-distended, soft, non-tender to palpation, normal BS Skin: Warm and dry, no rashes or lesions Neurological: MS: awake, alert and oriented x3, normal speech and fund of knowledge Motor: moves all extremities antigravity Psych: normal affect  Assessment & Plan:   Allergic reaction Stable. Pt follows with [facility]. Has been told to avoid tylenol and ibuprofen due failed challenge. Pt is able to tolerate Celebrex. The tylenol and ibuprofen problem became apparent 2 years ago. Pt also has some food insensitivities that she is aware of. Advised pt to follow with allergy and asthma center as instructed and continue taking zyrtec daily.  Bipolar disorder ([place]) Pt has bipolar disorder well controlled with lamotrigine and effexor. She was established with [facility] but has not been seen over one year. She had her medications still but wanted to establish with [facility] again. Will place a referral to [facility] and refill one month supply of lamotrigine and effexor.   Blood present in stool Pt stated she had 2 days of blood in her stools that resolved. This started the day after halloween. Pt had not other symptoms including N/V abdominal pain. She states her BM are regular and she does not struggle with constipation. Pt did endorse consuming a lot of red colored food and beverages for the halloween. CBC was done and was unremarkable. Advised pt this may be due to her intake on halloween but call our clinic back if this recurs. Pt agreeable.  Health care maintenance Flu shot, Hep C and HIV testing done this visit. Patient states she will get pap smear next visit.    See Encounters Tab for problem based charting.  Patient discussed with Dr. Tanja Port, MD Eligha Bridegroom. [facility], PGY-2

## 2022-01-04 NOTE — Patient Instructions (Signed)
Ms.Bristyl Milas Gain, it was a pleasure seeing you today! You endorsed feeling well today. Below are some of the things we talked about this visit. We look forward to seeing you in the follow up appointment!  Today we discussed: We will do lab work today and medication refills.   I have ordered the following labs today:  Lab Orders         Hepatitis C Antibody         HIV antibody (with reflex)         CBC with Diff         Hemoglobin A1c         CMP14 + Anion Gap       Referrals ordered today:   Referral Orders  No referral(s) requested today     I have ordered the following medication/changed the following medications:   Stop the following medications: Medications Discontinued During This Encounter  Medication Reason   venlafaxine (EFFEXOR) 75 MG tablet Reorder   celecoxib (CELEBREX) 100 MG capsule Reorder   lamoTRIgine (LAMICTAL PO) Reorder     Start the following medications: Meds ordered this encounter  Medications   celecoxib (CELEBREX) 100 MG capsule    Sig: Take 1 capsule (100 mg total) by mouth 2 (two) times daily as needed.    Dispense:  60 capsule    Refill:  0    This is for an observed challenge in the clinic office.   venlafaxine (EFFEXOR) 75 MG tablet    Sig: Take 1 tablet (75 mg total) by mouth daily.    Dispense:  30 tablet    Refill:  0   lamoTRIgine (LAMICTAL) 25 MG tablet    Sig: Take 1 tablet (25 mg total) by mouth daily.    Dispense:  30 tablet    Refill:  0     Follow-up: 1 month follow up with PCP for pap smear and follow up. We gave you a flu shot and will follow up in one month for pap smear.   Please make sure to arrive 15 minutes prior to your next appointment. If you arrive late, you may be asked to reschedule.   We look forward to seeing you next time. Please call our clinic at [phone] if you have any questions or concerns. The best time to call is Monday-Friday from 9am-4pm, but there is someone available 24/7. If after  hours or the weekend, call the main hospital number and ask for the Internal Medicine Resident On-Call. If you need medication refills, please notify your pharmacy one week in advance and they will send [place] a request.  Thank you for letting us take part in your care. Wishing you the best!  Thank you, Gwenevere Abbot, MD

## 2022-01-06 LAB — CMP14 + ANION GAP
ALT: 22 IU/L (ref 0–32)
AST: 17 IU/L (ref 0–40)
Albumin/Globulin Ratio: 1.8 (ref 1.2–2.2)
Albumin: 4.6 g/dL (ref 4.0–5.0)
Alkaline Phosphatase: 76 IU/L (ref 44–121)
Anion Gap: 15 mmol/L (ref 10.0–18.0)
BUN/Creatinine Ratio: 21 (ref 9–23)
BUN: 13 mg/dL (ref 6–20)
Bilirubin Total: <0.2 mg/dL (ref 0.0–1.2)
CO2: 20 mmol/L (ref 20–29)
Calcium: 9.6 mg/dL (ref 8.7–10.2)
Chloride: 103 mmol/L (ref 96–106)
Creatinine, Ser: 0.61 mg/dL (ref 0.57–1.00)
Globulin, Total: 2.6 g/dL (ref 1.5–4.5)
Glucose: 92 mg/dL (ref 70–99)
Potassium: 4.6 mmol/L (ref 3.5–5.2)
Sodium: 138 mmol/L (ref 134–144)
Total Protein: 7.2 g/dL (ref 6.0–8.5)
eGFR: 130 mL/min/{1.73_m2} (ref >59)

## 2022-01-06 LAB — HEPATITIS C ANTIBODY: Hep C Virus Ab: NONREACTIVE

## 2022-01-06 LAB — CBC WITH DIFFERENTIAL/PLATELET
Basophils Absolute: 0 10*3/uL (ref 0.0–0.2)
Basos: 1 %
EOS (ABSOLUTE): 0.2 10*3/uL (ref 0.0–0.4)
Eos: 4 %
Hematocrit: 42.3 % (ref 34.0–46.6)
Hemoglobin: 14.4 g/dL (ref 11.1–15.9)
Immature Grans (Abs): 0 10*3/uL (ref 0.0–0.1)
Immature Granulocytes: 0 %
Lymphocytes Absolute: 2.3 10*3/uL (ref 0.7–3.1)
Lymphs: 37 %
MCH: 31 pg (ref 26.6–33.0)
MCHC: 34 g/dL (ref 31.5–35.7)
MCV: 91 fL (ref 79–97)
Monocytes Absolute: 0.5 10*3/uL (ref 0.1–0.9)
Monocytes: 9 %
Neutrophils Absolute: 3.2 10*3/uL (ref 1.4–7.0)
Neutrophils: 49 %
Platelets: 318 10*3/uL (ref 150–450)
RBC: 4.65 x10E6/uL (ref 3.77–5.28)
RDW: 12.7 % (ref 11.7–15.4)
WBC: 6.3 10*3/uL (ref 3.4–10.8)

## 2022-01-06 LAB — HIV ANTIBODY (ROUTINE TESTING W REFLEX): HIV Screen 4th Generation wRfx: NONREACTIVE

## 2022-01-06 LAB — HEMOGLOBIN A1C
Est. average glucose Bld gHb Est-mCnc: 105 mg/dL
Hgb A1c MFr Bld: 5.3 % (ref 4.8–5.6)

## 2022-01-07 ENCOUNTER — Encounter: Payer: Self-pay | Admitting: Internal Medicine

## 2022-01-07 DIAGNOSIS — K921 Melena: Secondary | ICD-10-CM | POA: Insufficient documentation

## 2022-01-07 DIAGNOSIS — F319 Bipolar disorder, unspecified: Secondary | ICD-10-CM | POA: Insufficient documentation

## 2022-01-07 DIAGNOSIS — Z0000 Encounter for general adult medical examination without abnormal findings: Secondary | ICD-10-CM | POA: Insufficient documentation

## 2022-01-07 MED ORDER — LAMOTRIGINE 25 MG PO TABS: 25.0000 mg | ORAL_TABLET | Freq: Every day | ORAL | 0 refills | Status: AC

## 2022-01-07 MED ORDER — CELECOXIB 100 MG PO CAPS: 100.0000 mg | ORAL_CAPSULE | Freq: Two times a day (BID) | ORAL | 0 refills | Status: AC | PRN

## 2022-01-07 MED ORDER — VENLAFAXINE HCL 75 MG PO TABS: 75.0000 mg | ORAL_TABLET | Freq: Every day | ORAL | 0 refills | Status: AC

## 2022-01-07 NOTE — Assessment & Plan Note (Addendum)
Stable. Pt follows with [facility]. Has been told to avoid tylenol and ibuprofen due failed challenge. Pt is able to tolerate Celebrex. The tylenol and ibuprofen problem became apparent 2 years ago. Pt also has some food insensitivities that she is aware of. Advised pt to follow with allergy and asthma center as instructed and continue taking zyrtec daily.

## 2022-01-07 NOTE — Assessment & Plan Note (Signed)
Pt stated she had 2 days of blood in her stools that resolved. This started the day after halloween. Pt had not other symptoms including N/V abdominal pain. She states her BM are regular and she does not struggle with constipation. Pt did endorse consuming a lot of red colored food and beverages for the halloween. CBC was done and was unremarkable. Advised pt this may be due to her intake on halloween but call our clinic back if this recurs. Pt agreeable.

## 2022-01-07 NOTE — Assessment & Plan Note (Signed)
Pt has bipolar disorder well controlled with lamotrigine and effexor. She was established with [facility] but has not been seen over one year. She had her medications still but wanted to establish with [facility] again. Will place a referral to [facility] and refill one month supply of lamotrigine and effexor.

## 2022-01-07 NOTE — Assessment & Plan Note (Signed)
Flu shot, Hep C and HIV testing done this visit. Patient states she will get pap smear next visit.

## 2022-01-08 ENCOUNTER — Encounter: Payer: Self-pay | Admitting: Internal Medicine

## 2022-01-08 NOTE — Progress Notes (Signed)
imp

## 2022-01-08 NOTE — Addendum Note (Signed)
Addended by: Gwenevere Abbot on: 01/08/2022 01:53 PM   Modules accepted: Orders

## 2022-01-15 NOTE — Progress Notes (Signed)
Internal Medicine Clinic Attending  Case discussed with Dr. Khan  At the time of the visit.  We reviewed the resident's history and exam and pertinent patient test results.  I agree with the assessment, diagnosis, and plan of care documented in the resident's note.  

## 2022-05-16 ENCOUNTER — Ambulatory Visit: Payer: BC Managed Care – PPO | Admitting: Allergy & Immunology

## 2022-07-02 ENCOUNTER — Encounter: Payer: Self-pay | Admitting: Allergy & Immunology

## 2022-07-02 ENCOUNTER — Other Ambulatory Visit: Payer: Self-pay

## 2022-07-02 ENCOUNTER — Ambulatory Visit (INDEPENDENT_AMBULATORY_CARE_PROVIDER_SITE_OTHER): Payer: 59 | Admitting: Allergy & Immunology

## 2022-07-02 VITALS — BP 146/90 | HR 111 | Temp 98.2°F | Resp 18

## 2022-07-02 DIAGNOSIS — Z886 Allergy status to analgesic agent status: Secondary | ICD-10-CM | POA: Diagnosis not present

## 2022-07-02 DIAGNOSIS — T7800XA Anaphylactic reaction due to unspecified food, initial encounter: Secondary | ICD-10-CM | POA: Diagnosis not present

## 2022-07-02 DIAGNOSIS — M5430 Sciatica, unspecified side: Secondary | ICD-10-CM

## 2022-07-02 NOTE — Progress Notes (Unsigned)
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 Adverse effect of other drugs - Continue with Celebrex as needed like you are doing.   2. 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  - I think at this point there is nothing to worry about since your levels were so low.   3. Follow up as needed.    Please inform us of any Emergency Department visits, hospitalizations, or changes in symptoms. Call us before going to the ED for breathing or allergy symptoms since we might be able to fit you in for a sick visit. Feel free to contact us anytime with any questions, problems, or concerns.  It was a pleasure to see you again today! Congrats on graduating!   Websites that have reliable patient information: 1. American Academy of Asthma, Allergy, and Immunology: www.aaaai.org 2. Food Allergy Research and Education (FARE): foodallergy.org 3. Mothers of Asthmatics: http://www.asthmacommunitynetwork.org 4. American College of Allergy, Asthma, and Immunology: www.acaai.org   COVID-19 Vaccine Information can be found at: [email or web address] For questions related to vaccine distribution or appointments, please email [email or web address] or call [phone].   We realize that you might be concerned about having an allergic reaction to the COVID19 vaccines. To help with that concern, WE ARE OFFERING THE COVID19 VACCINES IN OUR OFFICE! Ask the front desk for dates!     "Like" [place] on Facebook and Instagram for our latest updates!      A healthy democracy works best when [employer] participate! Make sure you are registered to vote! If you have moved or changed any of  your contact information, you will need to get this updated before voting!  In some cases, you MAY be able to register to vote online: [email or web address]            Subjective:   Angela Hansen is a 22 y.o. 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  Angela Hansen is a 22 y.o. female presenting for {Blank single:19197::"a food challenge","a drug challenge","skin testing","a sick visit","an evaluation of ***","a follow up visit"}. She was last seen in July 2023 when we did a Celebrex challenge.  She did very well with this.  She does have a history of alpha gal.  Her last testing was done in the September and was stable from that obtained a couple years ago.  Since the last visit, she has done well. She is largely doing very well. The Celebrex is working. She was taking ibuprofen and this was stronger. She can actually drive to work and function. She did have to have the IUD removed because of the sciatica. It was every other day pain initially, but now she has the pain only during  the her period. She does treat this with the Celebrex and with CBT. She did have a Therapist, sports when she was in [employer], but she had her insurance changed so much and she does not have one any more. She has been taking the Celebrex apart and putting it in apple sauce to help with it.   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 She cna notice that she has some swelling intermittently after consuming them.   {Blank single:19197::"Skin Symptom History: ***"," "}  {Blank single:19197::"GERD Symptom History: ***"," "}    She graduated in August 2023 wiuth a Bachelor's in History and a minor in Classical History. She focused on Asian history. She is now working   Otherwise, there have been no changes to her past medical history, surgical history, family history, or social history.    ROS     Objective:   Blood pressure (!) 146/90, pulse (!) 111, temperature 98.2 F (36.8 C), temperature source Temporal, resp. rate 18, SpO2 93 %. There is no height or weight on file to calculate BMI.    Physical Exam   Diagnostic studies: {Blank single:19197::"none","deferred due to recent antihistamine use","labs sent instead"," "}  Spirometry: {Blank single:19197::"results normal (FEV1: ***%, FVC: ***%, FEV1/FVC: ***%)","results abnormal (FEV1: ***%, FVC: ***%, FEV1/FVC: ***%)"}.    {Blank single:19197::"Spirometry consistent with mild obstructive disease","Spirometry consistent with moderate obstructive disease","Spirometry consistent with severe obstructive disease","Spirometry consistent with possible restrictive disease","Spirometry consistent with mixed obstructive and restrictive disease","Spirometry uninterpretable due to technique","Spirometry consistent with normal pattern"}. {Blank single:19197::"Albuterol/Atrovent nebulizer","Xopenex/Atrovent nebulizer","Albuterol nebulizer","Albuterol four puffs via MDI","Xopenex four puffs via MDI"} treatment given in clinic with {Blank single:19197::"significant improvement in FEV1 per ATS criteria","significant improvement in FVC per ATS criteria","significant improvement in FEV1 and FVC per ATS  criteria","improvement in FEV1, but not significant per ATS criteria","improvement in FVC, but not significant per ATS criteria","improvement in FEV1 and FVC, but not significant per ATS criteria","no improvement"}.  Allergy Studies: {Blank single:19197::"none","labs sent instead"," "}    {Blank single:19197::"Allergy testing results were read and interpreted by myself, documented by clinical staff."," "}      Malachi Bonds, MD  [facility]

## 2022-07-02 NOTE — Patient Instructions (Addendum)
1. Adverse effect of other drugs - Continue with Celebrex as needed like you are doing.   2. 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  - I think at this point there is nothing to worry about since your levels were so low.   3. Follow up as needed.    Please inform us of any Emergency Department visits, hospitalizations, or changes in symptoms. Call us before going to the ED for breathing or allergy symptoms since we might be able to fit you in for a sick visit. Feel free to contact us anytime with any questions, problems, or concerns.  It was a pleasure to see you again today! Congrats on graduating!   Websites that have reliable patient information: 1. American Academy of Asthma, Allergy, and Immunology: www.aaaai.org 2. Food Allergy Research and Education (FARE): foodallergy.org 3. Mothers of Asthmatics: http://www.asthmacommunitynetwork.org 4. American College of Allergy, Asthma, and Immunology: www.acaai.org   COVID-19 Vaccine Information can be found at: [email or web address] For questions related to vaccine distribution or appointments, please email [email or web address] or call [phone].   We realize that you might be concerned about having an allergic reaction to the COVID19 vaccines. To help with that concern, WE ARE OFFERING THE COVID19 VACCINES IN OUR OFFICE! Ask the front desk for dates!     "Like" [place] on Facebook and Instagram for our latest updates!      A healthy democracy works best when [employer] participate! Make sure you are registered to vote! If you have moved or changed any of your contact information, you will need to get this updated before voting!  In some cases, you MAY be able to register to vote online: [email or web address]

## 2022-07-03 ENCOUNTER — Encounter: Payer: Self-pay | Admitting: Allergy & Immunology

## 2022-07-06 LAB — ALPHA-GAL PANEL
Allergen Lamb IgE: 0.13 kU/L — AB
Beef IgE: 0.15 kU/L — AB
IgE (Immunoglobulin E), Serum: 115 IU/mL (ref 6–495)
O215-IgE Alpha-Gal: <0.1 kU/L
Pork IgE: <0.1 kU/L

## 2022-10-26 IMAGING — US US PELVIS COMPLETE WITH TRANSVAGINAL
1 series · 13 of 25 positions shown · non-contrast
Comparison: None

CLINICAL DATA: Pelvic pain radiating to the thigh

EXAM:
TRANSABDOMINAL AND TRANSVAGINAL ULTRASOUND OF PELVIS
TECHNIQUE: Both transabdominal and transvaginal ultrasound examinations of the
pelvis were performed. Transabdominal technique was performed for
global imaging of the pelvis including uterus, ovaries, adnexal
regions, and pelvic cul-de-sac. It was necessary to proceed with
endovaginal exam following the transabdominal exam to visualize the
uterus endometrium ovaries.

[Series 1: us pelvis complete with transvaginal · 0.19mm/px · 73 acquisitions, 13 frames shown]
[im 1/73]
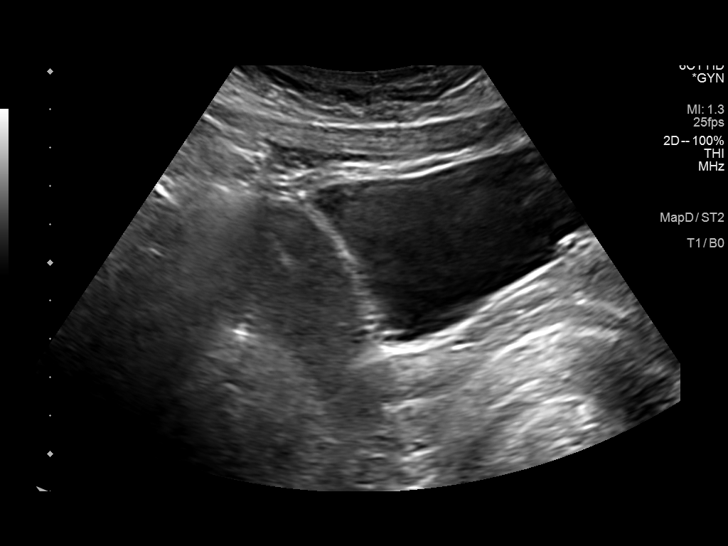
[im 7/73]
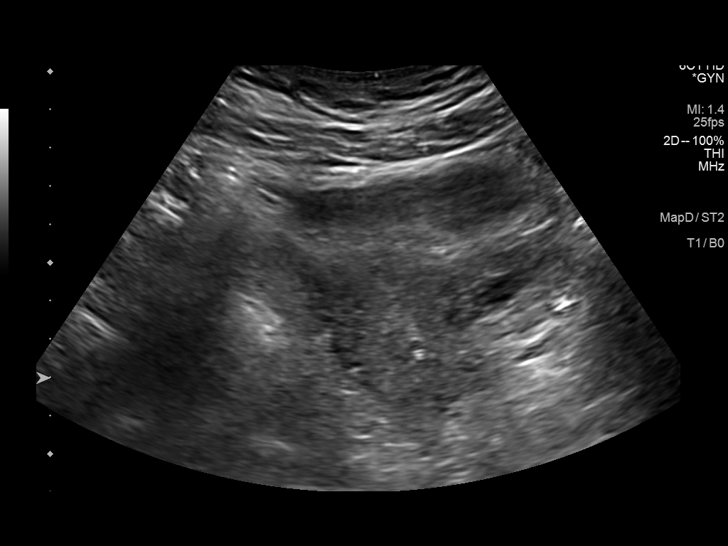
[im 13/73]
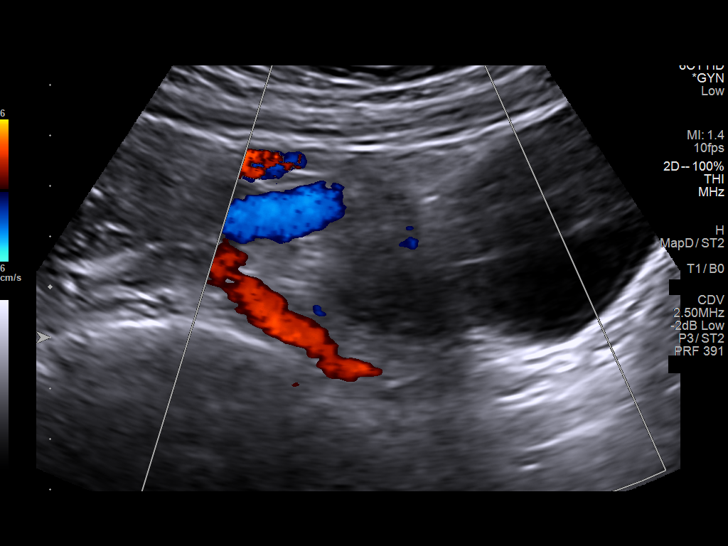
[im 19/73]
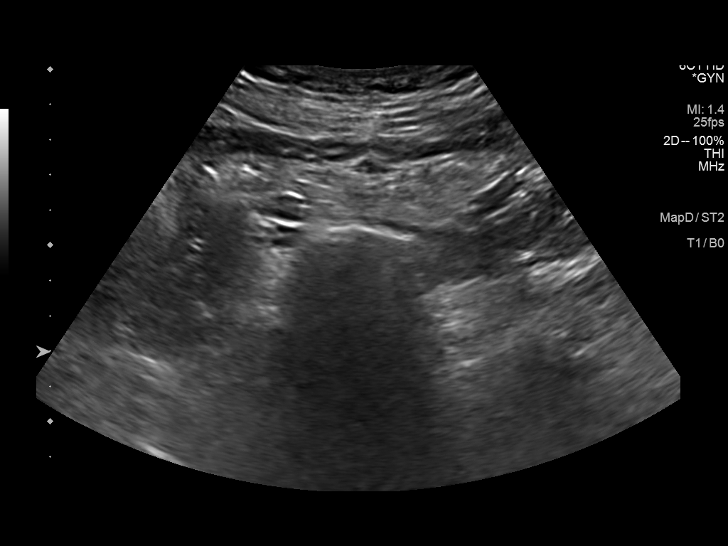
[im 25/73]
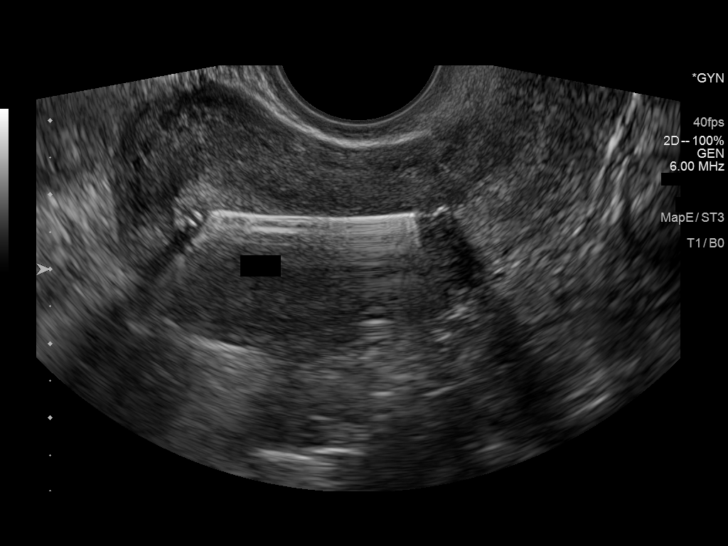
[im 31/73]
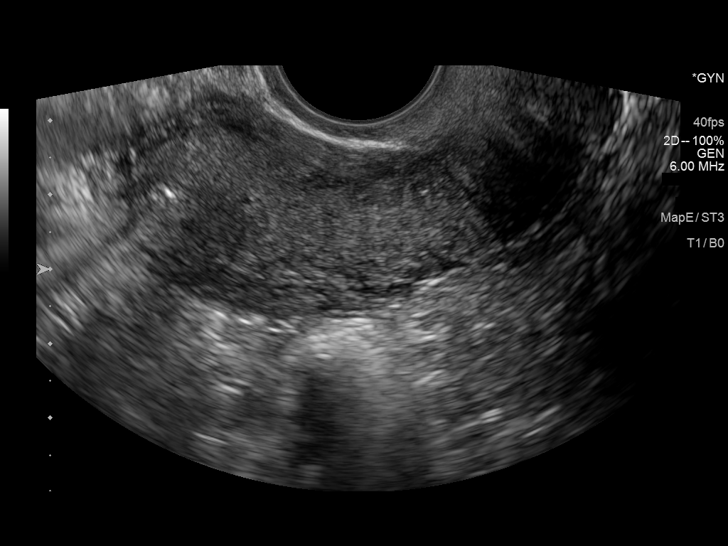
[im 37/73]
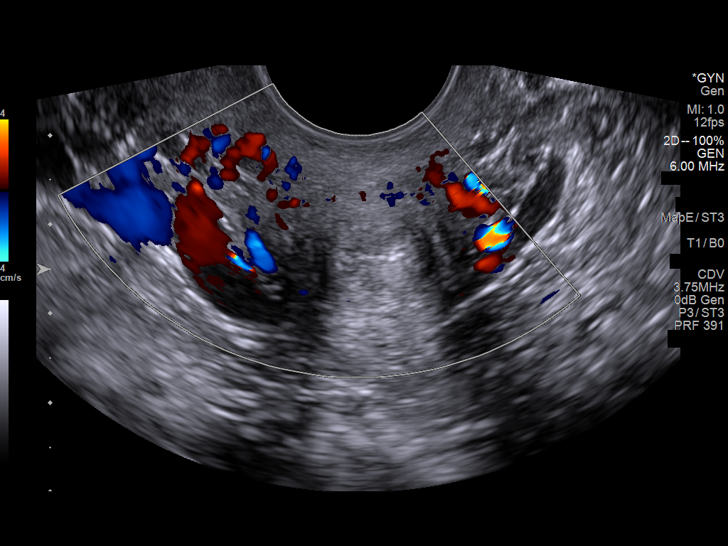
[im 43/73]
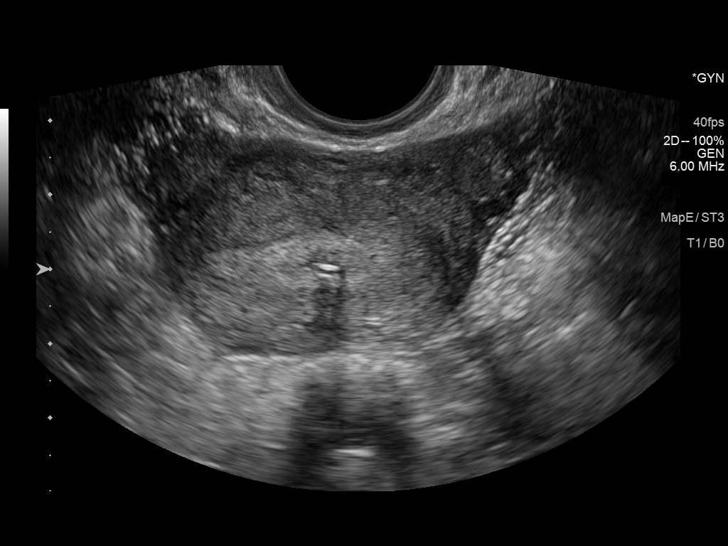
[im 49/73]
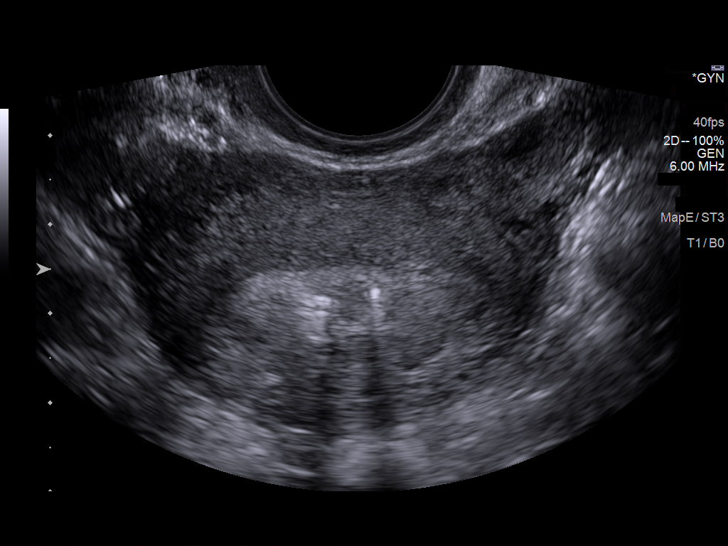
[im 55/73]
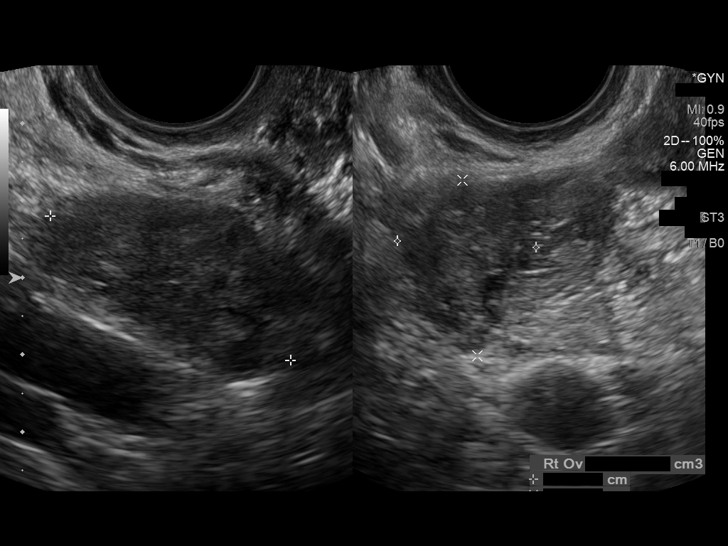
[im 61/73]
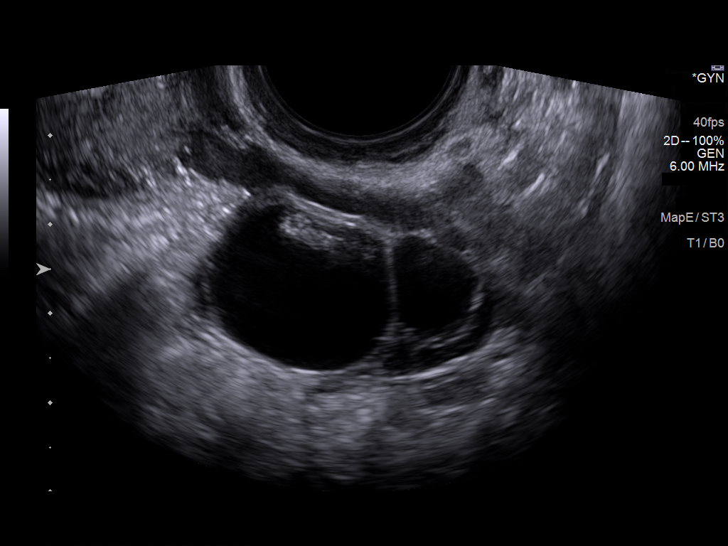
[im 67/73]
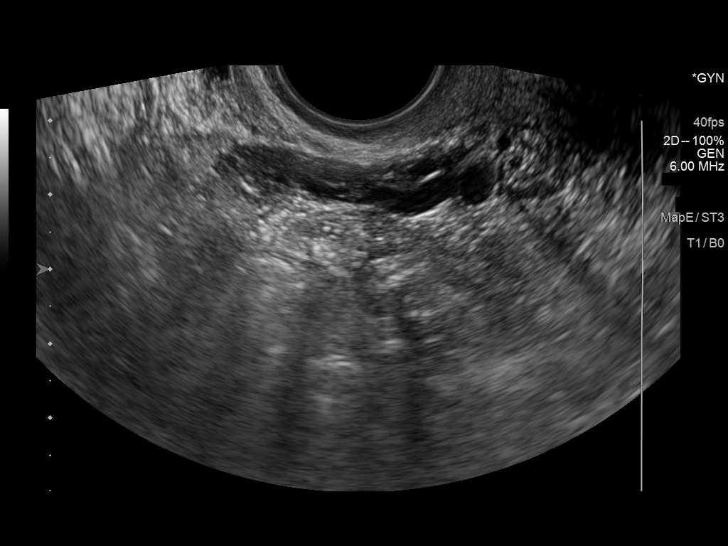
[im 73/73]
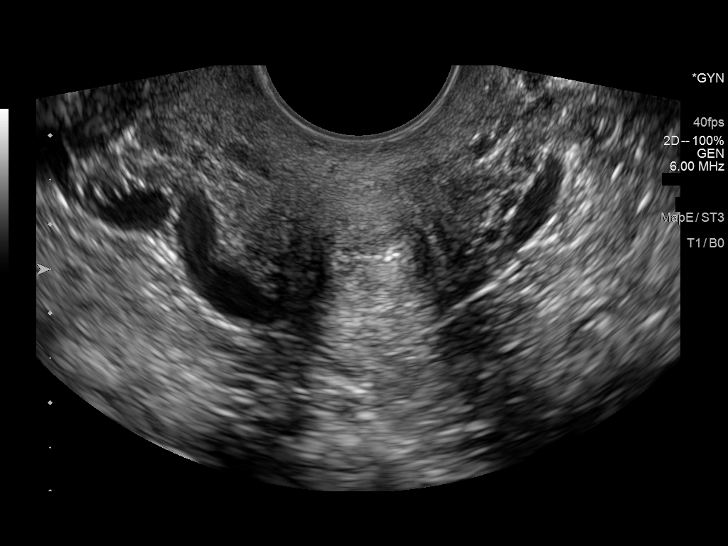

[13 of 25 positions shown; findings below may reference images not displayed]

FINDINGS: Uterus

Measurements: 7.8 x 3 x 4.2 cm = volume: 53 mL. Nabothian cysts
within the cervix.

Endometrium

Thickness: 8 mm. IUD within the uterus. On transverse endovaginal
images, the left T arm appears to protrude beyond the endometrial
echoes and into the myometrium.

Right ovary

Measurements: 3.6 x 2.3 x 1.8 cm = volume: 8 mL. Normal
appearance/no adnexal mass.

Left ovary

Measurements: 3.5 x 2.4 x 3.6 cm = volume: 16 mL. Hemorrhagic cyst
in the left ovary measuring 1.8 cm, no further follow-up recommended

Other findings

No abnormal free fluid.
IMPRESSION: 1. Suspect malpositioned IUD with partial perforation of the left T
arms into the myometrium.
2. Small hemorrhagic cyst in the left ovary.
3. Otherwise negative pelvic ultrasound
